# Patient Record
Sex: Male | Born: 2005
Health system: Southern US, Community
[De-identification: ages and names within clinical notes are randomized; demographics above are authoritative.]

---
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
## 2023-01-06 DIAGNOSIS — F4011 Social phobia, generalized: Secondary | ICD-10-CM | POA: Diagnosis not present

## 2023-01-06 DIAGNOSIS — F411 Generalized anxiety disorder: Secondary | ICD-10-CM | POA: Diagnosis not present

## 2023-01-12 DIAGNOSIS — F411 Generalized anxiety disorder: Secondary | ICD-10-CM | POA: Diagnosis not present

## 2023-01-12 DIAGNOSIS — F4011 Social phobia, generalized: Secondary | ICD-10-CM | POA: Diagnosis not present

## 2023-01-16 ENCOUNTER — Other Ambulatory Visit: Payer: Self-pay

## 2023-01-17 ENCOUNTER — Other Ambulatory Visit: Payer: Self-pay

## 2023-01-17 MED ORDER — SERTRALINE HCL 100 MG PO TABS
200.0000 mg | ORAL_TABLET | Freq: Every morning | ORAL | 0 refills | Status: DC
  Filled 2023-01-17: qty 180, 90d supply, fill #0

## 2023-01-17 MED ORDER — LISDEXAMFETAMINE DIMESYLATE 60 MG PO CAPS
60.0000 mg | ORAL_CAPSULE | Freq: Every day | ORAL | 0 refills | Status: DC
  Filled 2023-01-17: qty 30, 30d supply, fill #0

## 2023-01-18 ENCOUNTER — Other Ambulatory Visit: Payer: Self-pay

## 2023-01-20 DIAGNOSIS — F4011 Social phobia, generalized: Secondary | ICD-10-CM | POA: Diagnosis not present

## 2023-01-20 DIAGNOSIS — F411 Generalized anxiety disorder: Secondary | ICD-10-CM | POA: Diagnosis not present

## 2023-02-03 DIAGNOSIS — F411 Generalized anxiety disorder: Secondary | ICD-10-CM | POA: Diagnosis not present

## 2023-02-03 DIAGNOSIS — F4011 Social phobia, generalized: Secondary | ICD-10-CM | POA: Diagnosis not present

## 2023-02-17 DIAGNOSIS — F4011 Social phobia, generalized: Secondary | ICD-10-CM | POA: Diagnosis not present

## 2023-02-17 DIAGNOSIS — F411 Generalized anxiety disorder: Secondary | ICD-10-CM | POA: Diagnosis not present

## 2023-02-21 ENCOUNTER — Other Ambulatory Visit: Payer: Self-pay

## 2023-02-21 DIAGNOSIS — F411 Generalized anxiety disorder: Secondary | ICD-10-CM | POA: Diagnosis not present

## 2023-02-21 DIAGNOSIS — F4011 Social phobia, generalized: Secondary | ICD-10-CM | POA: Diagnosis not present

## 2023-02-21 MED ORDER — LISDEXAMFETAMINE DIMESYLATE 60 MG PO CAPS
60.0000 mg | ORAL_CAPSULE | Freq: Every morning | ORAL | 0 refills | Status: DC
  Filled 2023-03-22: qty 10, 10d supply, fill #0
  Filled 2023-03-22: qty 30, 30d supply, fill #0
  Filled 2023-03-22: qty 20, 20d supply, fill #0

## 2023-02-21 MED ORDER — LISDEXAMFETAMINE DIMESYLATE 60 MG PO CAPS
60.0000 mg | ORAL_CAPSULE | Freq: Every morning | ORAL | 0 refills | Status: DC
  Filled 2023-02-21: qty 30, 30d supply, fill #0

## 2023-03-03 DIAGNOSIS — F411 Generalized anxiety disorder: Secondary | ICD-10-CM | POA: Diagnosis not present

## 2023-03-03 DIAGNOSIS — F4011 Social phobia, generalized: Secondary | ICD-10-CM | POA: Diagnosis not present

## 2023-03-09 DIAGNOSIS — F411 Generalized anxiety disorder: Secondary | ICD-10-CM | POA: Diagnosis not present

## 2023-03-09 DIAGNOSIS — F4011 Social phobia, generalized: Secondary | ICD-10-CM | POA: Diagnosis not present

## 2023-03-17 DIAGNOSIS — F411 Generalized anxiety disorder: Secondary | ICD-10-CM | POA: Diagnosis not present

## 2023-03-17 DIAGNOSIS — F4011 Social phobia, generalized: Secondary | ICD-10-CM | POA: Diagnosis not present

## 2023-03-22 ENCOUNTER — Other Ambulatory Visit: Payer: Self-pay

## 2023-03-31 DIAGNOSIS — F4011 Social phobia, generalized: Secondary | ICD-10-CM | POA: Diagnosis not present

## 2023-03-31 DIAGNOSIS — F411 Generalized anxiety disorder: Secondary | ICD-10-CM | POA: Diagnosis not present

## 2023-04-04 ENCOUNTER — Other Ambulatory Visit: Payer: Self-pay

## 2023-04-04 MED ORDER — LISDEXAMFETAMINE DIMESYLATE 60 MG PO CAPS
60.0000 mg | ORAL_CAPSULE | Freq: Every morning | ORAL | 0 refills | Status: DC
  Filled 2023-05-19: qty 30, 30d supply, fill #0

## 2023-04-04 MED ORDER — LISDEXAMFETAMINE DIMESYLATE 60 MG PO CAPS
60.0000 mg | ORAL_CAPSULE | Freq: Every morning | ORAL | 0 refills | Status: DC
  Filled 2023-04-19: qty 30, 30d supply, fill #0

## 2023-04-05 DIAGNOSIS — F4011 Social phobia, generalized: Secondary | ICD-10-CM | POA: Diagnosis not present

## 2023-04-05 DIAGNOSIS — F411 Generalized anxiety disorder: Secondary | ICD-10-CM | POA: Diagnosis not present

## 2023-04-17 ENCOUNTER — Other Ambulatory Visit: Payer: Self-pay

## 2023-04-18 ENCOUNTER — Other Ambulatory Visit: Payer: Self-pay

## 2023-04-18 MED ORDER — SERTRALINE HCL 100 MG PO TABS
200.0000 mg | ORAL_TABLET | Freq: Every morning | ORAL | 0 refills | Status: DC
  Filled 2023-04-18: qty 180, 90d supply, fill #0

## 2023-04-19 ENCOUNTER — Other Ambulatory Visit: Payer: Self-pay

## 2023-04-19 DIAGNOSIS — F411 Generalized anxiety disorder: Secondary | ICD-10-CM | POA: Diagnosis not present

## 2023-04-19 DIAGNOSIS — F4011 Social phobia, generalized: Secondary | ICD-10-CM | POA: Diagnosis not present

## 2023-05-03 DIAGNOSIS — F4011 Social phobia, generalized: Secondary | ICD-10-CM | POA: Diagnosis not present

## 2023-05-03 DIAGNOSIS — F411 Generalized anxiety disorder: Secondary | ICD-10-CM | POA: Diagnosis not present

## 2023-05-17 DIAGNOSIS — F4011 Social phobia, generalized: Secondary | ICD-10-CM | POA: Diagnosis not present

## 2023-05-17 DIAGNOSIS — F411 Generalized anxiety disorder: Secondary | ICD-10-CM | POA: Diagnosis not present

## 2023-05-19 ENCOUNTER — Other Ambulatory Visit: Payer: Self-pay

## 2023-05-31 DIAGNOSIS — F4011 Social phobia, generalized: Secondary | ICD-10-CM | POA: Diagnosis not present

## 2023-05-31 DIAGNOSIS — F411 Generalized anxiety disorder: Secondary | ICD-10-CM | POA: Diagnosis not present

## 2023-06-01 ENCOUNTER — Other Ambulatory Visit: Payer: Self-pay

## 2023-06-01 MED ORDER — LISDEXAMFETAMINE DIMESYLATE 60 MG PO CAPS: 60.0000 mg | ORAL_CAPSULE | Freq: Every morning | ORAL | 0 refills | Status: DC

## 2023-06-01 MED ORDER — LISDEXAMFETAMINE DIMESYLATE 60 MG PO CAPS
60.0000 mg | ORAL_CAPSULE | Freq: Every morning | ORAL | 0 refills | Status: DC
  Filled 2023-06-20: qty 30, 30d supply, fill #0

## 2023-06-20 ENCOUNTER — Other Ambulatory Visit: Payer: Self-pay

## 2023-06-28 DIAGNOSIS — F4011 Social phobia, generalized: Secondary | ICD-10-CM | POA: Diagnosis not present

## 2023-06-28 DIAGNOSIS — F411 Generalized anxiety disorder: Secondary | ICD-10-CM | POA: Diagnosis not present

## 2023-07-05 ENCOUNTER — Other Ambulatory Visit: Payer: Self-pay

## 2023-07-05 MED ORDER — SERTRALINE HCL 100 MG PO TABS
200.0000 mg | ORAL_TABLET | Freq: Every morning | ORAL | 0 refills | Status: DC
  Filled 2023-07-05: qty 180, 90d supply, fill #0

## 2023-07-12 DIAGNOSIS — F411 Generalized anxiety disorder: Secondary | ICD-10-CM | POA: Diagnosis not present

## 2023-07-12 DIAGNOSIS — F4011 Social phobia, generalized: Secondary | ICD-10-CM | POA: Diagnosis not present

## 2023-07-19 DIAGNOSIS — F4011 Social phobia, generalized: Secondary | ICD-10-CM | POA: Diagnosis not present

## 2023-07-19 DIAGNOSIS — F411 Generalized anxiety disorder: Secondary | ICD-10-CM | POA: Diagnosis not present

## 2023-07-21 ENCOUNTER — Other Ambulatory Visit: Payer: Self-pay

## 2023-07-21 MED ORDER — LISDEXAMFETAMINE DIMESYLATE 60 MG PO CAPS
60.0000 mg | ORAL_CAPSULE | Freq: Every morning | ORAL | 0 refills | Status: AC
  Filled 2023-07-21: qty 30, 30d supply, fill #0

## 2023-07-21 MED ORDER — LISDEXAMFETAMINE DIMESYLATE 60 MG PO CAPS
60.0000 mg | ORAL_CAPSULE | Freq: Every morning | ORAL | 0 refills | Status: DC
  Filled 2023-08-22: qty 30, 30d supply, fill #0

## 2023-07-24 ENCOUNTER — Other Ambulatory Visit: Payer: Self-pay

## 2023-07-25 ENCOUNTER — Other Ambulatory Visit: Payer: Self-pay

## 2023-07-26 DIAGNOSIS — F411 Generalized anxiety disorder: Secondary | ICD-10-CM | POA: Diagnosis not present

## 2023-07-26 DIAGNOSIS — F4011 Social phobia, generalized: Secondary | ICD-10-CM | POA: Diagnosis not present

## 2023-08-02 DIAGNOSIS — F411 Generalized anxiety disorder: Secondary | ICD-10-CM | POA: Diagnosis not present

## 2023-08-02 DIAGNOSIS — F4011 Social phobia, generalized: Secondary | ICD-10-CM | POA: Diagnosis not present

## 2023-08-04 ENCOUNTER — Other Ambulatory Visit (HOSPITAL_COMMUNITY): Payer: Self-pay

## 2023-08-09 DIAGNOSIS — F411 Generalized anxiety disorder: Secondary | ICD-10-CM | POA: Diagnosis not present

## 2023-08-09 DIAGNOSIS — F4011 Social phobia, generalized: Secondary | ICD-10-CM | POA: Diagnosis not present

## 2023-08-16 DIAGNOSIS — F411 Generalized anxiety disorder: Secondary | ICD-10-CM | POA: Diagnosis not present

## 2023-08-16 DIAGNOSIS — F4011 Social phobia, generalized: Secondary | ICD-10-CM | POA: Diagnosis not present

## 2023-08-22 ENCOUNTER — Other Ambulatory Visit: Payer: Self-pay

## 2023-08-30 DIAGNOSIS — F411 Generalized anxiety disorder: Secondary | ICD-10-CM | POA: Diagnosis not present

## 2023-08-30 DIAGNOSIS — F4011 Social phobia, generalized: Secondary | ICD-10-CM | POA: Diagnosis not present

## 2023-09-06 DIAGNOSIS — F411 Generalized anxiety disorder: Secondary | ICD-10-CM | POA: Diagnosis not present

## 2023-09-06 DIAGNOSIS — F4011 Social phobia, generalized: Secondary | ICD-10-CM | POA: Diagnosis not present

## 2023-09-13 DIAGNOSIS — F411 Generalized anxiety disorder: Secondary | ICD-10-CM | POA: Diagnosis not present

## 2023-09-13 DIAGNOSIS — F4011 Social phobia, generalized: Secondary | ICD-10-CM | POA: Diagnosis not present

## 2023-09-19 ENCOUNTER — Other Ambulatory Visit: Payer: Self-pay

## 2023-09-19 MED ORDER — LISDEXAMFETAMINE DIMESYLATE 60 MG PO CAPS
60.0000 mg | ORAL_CAPSULE | Freq: Every morning | ORAL | 0 refills | Status: AC
  Filled 2023-09-19 – 2023-09-20 (×2): qty 30, 30d supply, fill #0

## 2023-09-19 MED ORDER — LISDEXAMFETAMINE DIMESYLATE 60 MG PO CAPS
60.0000 mg | ORAL_CAPSULE | Freq: Every morning | ORAL | 0 refills | Status: DC
  Filled 2023-11-16: qty 30, 30d supply, fill #0

## 2023-09-19 MED ORDER — LISDEXAMFETAMINE DIMESYLATE 60 MG PO CAPS
60.0000 mg | ORAL_CAPSULE | Freq: Every morning | ORAL | 0 refills | Status: AC
  Filled 2023-10-17 – 2023-10-18 (×3): qty 30, 30d supply, fill #0

## 2023-09-20 ENCOUNTER — Other Ambulatory Visit: Payer: Self-pay

## 2023-10-04 DIAGNOSIS — F4011 Social phobia, generalized: Secondary | ICD-10-CM | POA: Diagnosis not present

## 2023-10-04 DIAGNOSIS — F411 Generalized anxiety disorder: Secondary | ICD-10-CM | POA: Diagnosis not present

## 2023-10-06 ENCOUNTER — Other Ambulatory Visit: Payer: Self-pay

## 2023-10-06 MED ORDER — SERTRALINE HCL 100 MG PO TABS
200.0000 mg | ORAL_TABLET | Freq: Every morning | ORAL | 0 refills | Status: DC
Start: 2023-10-05 — End: 2024-01-16
  Filled 2023-10-06 – 2023-10-18 (×2): qty 180, 90d supply, fill #0

## 2023-10-11 DIAGNOSIS — F4011 Social phobia, generalized: Secondary | ICD-10-CM | POA: Diagnosis not present

## 2023-10-11 DIAGNOSIS — F411 Generalized anxiety disorder: Secondary | ICD-10-CM | POA: Diagnosis not present

## 2023-10-16 ENCOUNTER — Other Ambulatory Visit: Payer: Self-pay

## 2023-10-17 ENCOUNTER — Other Ambulatory Visit (HOSPITAL_COMMUNITY): Payer: Self-pay

## 2023-10-17 ENCOUNTER — Other Ambulatory Visit: Payer: Self-pay

## 2023-10-18 ENCOUNTER — Other Ambulatory Visit (HOSPITAL_COMMUNITY): Payer: Self-pay

## 2023-10-18 ENCOUNTER — Other Ambulatory Visit: Payer: Self-pay

## 2023-10-18 DIAGNOSIS — F411 Generalized anxiety disorder: Secondary | ICD-10-CM | POA: Diagnosis not present

## 2023-10-18 DIAGNOSIS — F4011 Social phobia, generalized: Secondary | ICD-10-CM | POA: Diagnosis not present

## 2023-10-25 DIAGNOSIS — F4011 Social phobia, generalized: Secondary | ICD-10-CM | POA: Diagnosis not present

## 2023-10-25 DIAGNOSIS — F411 Generalized anxiety disorder: Secondary | ICD-10-CM | POA: Diagnosis not present

## 2023-10-26 DIAGNOSIS — F909 Attention-deficit hyperactivity disorder, unspecified type: Secondary | ICD-10-CM | POA: Diagnosis not present

## 2023-10-26 DIAGNOSIS — Z1331 Encounter for screening for depression: Secondary | ICD-10-CM | POA: Diagnosis not present

## 2023-10-26 DIAGNOSIS — Z113 Encounter for screening for infections with a predominantly sexual mode of transmission: Secondary | ICD-10-CM | POA: Diagnosis not present

## 2023-10-26 DIAGNOSIS — Z1330 Encounter for screening examination for mental health and behavioral disorders, unspecified: Secondary | ICD-10-CM | POA: Diagnosis not present

## 2023-10-26 DIAGNOSIS — Z136 Encounter for screening for cardiovascular disorders: Secondary | ICD-10-CM | POA: Diagnosis not present

## 2023-10-26 DIAGNOSIS — Z789 Other specified health status: Secondary | ICD-10-CM | POA: Diagnosis not present

## 2023-10-26 DIAGNOSIS — Z9189 Other specified personal risk factors, not elsewhere classified: Secondary | ICD-10-CM | POA: Diagnosis not present

## 2023-10-26 DIAGNOSIS — Z0000 Encounter for general adult medical examination without abnormal findings: Secondary | ICD-10-CM | POA: Diagnosis not present

## 2023-10-26 DIAGNOSIS — Z114 Encounter for screening for human immunodeficiency virus [HIV]: Secondary | ICD-10-CM | POA: Diagnosis not present

## 2023-10-26 DIAGNOSIS — Z1159 Encounter for screening for other viral diseases: Secondary | ICD-10-CM | POA: Diagnosis not present

## 2023-11-01 DIAGNOSIS — F4011 Social phobia, generalized: Secondary | ICD-10-CM | POA: Diagnosis not present

## 2023-11-01 DIAGNOSIS — F411 Generalized anxiety disorder: Secondary | ICD-10-CM | POA: Diagnosis not present

## 2023-11-07 DIAGNOSIS — Z1159 Encounter for screening for other viral diseases: Secondary | ICD-10-CM | POA: Diagnosis not present

## 2023-11-07 DIAGNOSIS — Z114 Encounter for screening for human immunodeficiency virus [HIV]: Secondary | ICD-10-CM | POA: Diagnosis not present

## 2023-11-07 DIAGNOSIS — Z113 Encounter for screening for infections with a predominantly sexual mode of transmission: Secondary | ICD-10-CM | POA: Diagnosis not present

## 2023-11-07 DIAGNOSIS — Z0000 Encounter for general adult medical examination without abnormal findings: Secondary | ICD-10-CM | POA: Diagnosis not present

## 2023-11-07 DIAGNOSIS — Z136 Encounter for screening for cardiovascular disorders: Secondary | ICD-10-CM | POA: Diagnosis not present

## 2023-11-08 DIAGNOSIS — F411 Generalized anxiety disorder: Secondary | ICD-10-CM | POA: Diagnosis not present

## 2023-11-08 DIAGNOSIS — F4011 Social phobia, generalized: Secondary | ICD-10-CM | POA: Diagnosis not present

## 2023-11-15 DIAGNOSIS — F411 Generalized anxiety disorder: Secondary | ICD-10-CM | POA: Diagnosis not present

## 2023-11-15 DIAGNOSIS — F4011 Social phobia, generalized: Secondary | ICD-10-CM | POA: Diagnosis not present

## 2023-11-15 DIAGNOSIS — L648 Other androgenic alopecia: Secondary | ICD-10-CM | POA: Diagnosis not present

## 2023-11-16 ENCOUNTER — Other Ambulatory Visit: Payer: Self-pay

## 2023-11-16 ENCOUNTER — Other Ambulatory Visit (HOSPITAL_COMMUNITY): Payer: Self-pay

## 2023-11-29 DIAGNOSIS — F411 Generalized anxiety disorder: Secondary | ICD-10-CM | POA: Diagnosis not present

## 2023-11-29 DIAGNOSIS — F4011 Social phobia, generalized: Secondary | ICD-10-CM | POA: Diagnosis not present

## 2023-12-06 DIAGNOSIS — F411 Generalized anxiety disorder: Secondary | ICD-10-CM | POA: Diagnosis not present

## 2023-12-06 DIAGNOSIS — F4011 Social phobia, generalized: Secondary | ICD-10-CM | POA: Diagnosis not present

## 2023-12-13 DIAGNOSIS — F4011 Social phobia, generalized: Secondary | ICD-10-CM | POA: Diagnosis not present

## 2023-12-13 DIAGNOSIS — F411 Generalized anxiety disorder: Secondary | ICD-10-CM | POA: Diagnosis not present

## 2023-12-18 ENCOUNTER — Other Ambulatory Visit: Payer: Self-pay

## 2023-12-18 ENCOUNTER — Other Ambulatory Visit (HOSPITAL_COMMUNITY): Payer: Self-pay

## 2023-12-19 ENCOUNTER — Other Ambulatory Visit: Payer: Self-pay

## 2023-12-20 ENCOUNTER — Other Ambulatory Visit: Payer: Self-pay

## 2023-12-20 DIAGNOSIS — F411 Generalized anxiety disorder: Secondary | ICD-10-CM | POA: Diagnosis not present

## 2023-12-20 DIAGNOSIS — F4011 Social phobia, generalized: Secondary | ICD-10-CM | POA: Diagnosis not present

## 2023-12-20 MED ORDER — LISDEXAMFETAMINE DIMESYLATE 60 MG PO CAPS
60.0000 mg | ORAL_CAPSULE | Freq: Every morning | ORAL | 0 refills | Status: DC
  Filled 2024-02-21: qty 30, 30d supply, fill #0

## 2023-12-20 MED ORDER — LISDEXAMFETAMINE DIMESYLATE 60 MG PO CAPS
60.0000 mg | ORAL_CAPSULE | Freq: Every morning | ORAL | 0 refills | Status: AC
  Filled 2023-12-20: qty 23, 23d supply, fill #0
  Filled 2023-12-20: qty 7, 7d supply, fill #0
  Filled 2023-12-25 (×2): qty 30, 30d supply, fill #0

## 2023-12-20 MED ORDER — LISDEXAMFETAMINE DIMESYLATE 60 MG PO CAPS
60.0000 mg | ORAL_CAPSULE | Freq: Every morning | ORAL | 0 refills | Status: AC
  Filled 2024-01-23: qty 30, 30d supply, fill #0

## 2023-12-25 ENCOUNTER — Other Ambulatory Visit (HOSPITAL_COMMUNITY): Payer: Self-pay

## 2023-12-25 ENCOUNTER — Other Ambulatory Visit: Payer: Self-pay

## 2023-12-27 ENCOUNTER — Other Ambulatory Visit (HOSPITAL_COMMUNITY): Payer: Self-pay

## 2023-12-27 DIAGNOSIS — F4011 Social phobia, generalized: Secondary | ICD-10-CM | POA: Diagnosis not present

## 2023-12-27 DIAGNOSIS — F411 Generalized anxiety disorder: Secondary | ICD-10-CM | POA: Diagnosis not present

## 2024-01-03 DIAGNOSIS — F411 Generalized anxiety disorder: Secondary | ICD-10-CM | POA: Diagnosis not present

## 2024-01-03 DIAGNOSIS — F4011 Social phobia, generalized: Secondary | ICD-10-CM | POA: Diagnosis not present

## 2024-01-10 DIAGNOSIS — F4011 Social phobia, generalized: Secondary | ICD-10-CM | POA: Diagnosis not present

## 2024-01-10 DIAGNOSIS — F411 Generalized anxiety disorder: Secondary | ICD-10-CM | POA: Diagnosis not present

## 2024-01-15 ENCOUNTER — Other Ambulatory Visit (HOSPITAL_COMMUNITY): Payer: Self-pay

## 2024-01-16 ENCOUNTER — Other Ambulatory Visit: Payer: Self-pay

## 2024-01-16 ENCOUNTER — Other Ambulatory Visit (HOSPITAL_COMMUNITY): Payer: Self-pay

## 2024-01-16 MED ORDER — SERTRALINE HCL 100 MG PO TABS
200.0000 mg | ORAL_TABLET | Freq: Every morning | ORAL | 0 refills | Status: AC
  Filled 2024-01-16: qty 180, 90d supply, fill #0

## 2024-01-17 DIAGNOSIS — F4011 Social phobia, generalized: Secondary | ICD-10-CM | POA: Diagnosis not present

## 2024-01-17 DIAGNOSIS — F411 Generalized anxiety disorder: Secondary | ICD-10-CM | POA: Diagnosis not present

## 2024-01-23 ENCOUNTER — Other Ambulatory Visit (HOSPITAL_COMMUNITY): Payer: Self-pay

## 2024-01-23 ENCOUNTER — Other Ambulatory Visit: Payer: Self-pay

## 2024-01-31 DIAGNOSIS — F4011 Social phobia, generalized: Secondary | ICD-10-CM | POA: Diagnosis not present

## 2024-01-31 DIAGNOSIS — F411 Generalized anxiety disorder: Secondary | ICD-10-CM | POA: Diagnosis not present

## 2024-02-21 ENCOUNTER — Other Ambulatory Visit (HOSPITAL_COMMUNITY): Payer: Self-pay

## 2024-02-21 ENCOUNTER — Other Ambulatory Visit: Payer: Self-pay

## 2024-02-21 DIAGNOSIS — F411 Generalized anxiety disorder: Secondary | ICD-10-CM | POA: Diagnosis not present

## 2024-02-21 DIAGNOSIS — F4011 Social phobia, generalized: Secondary | ICD-10-CM | POA: Diagnosis not present

## 2024-02-28 DIAGNOSIS — F4011 Social phobia, generalized: Secondary | ICD-10-CM | POA: Diagnosis not present

## 2024-02-28 DIAGNOSIS — F411 Generalized anxiety disorder: Secondary | ICD-10-CM | POA: Diagnosis not present

## 2024-03-13 DIAGNOSIS — F4011 Social phobia, generalized: Secondary | ICD-10-CM | POA: Diagnosis not present

## 2024-03-13 DIAGNOSIS — F411 Generalized anxiety disorder: Secondary | ICD-10-CM | POA: Diagnosis not present

## 2024-03-22 ENCOUNTER — Other Ambulatory Visit (HOSPITAL_COMMUNITY): Payer: Self-pay

## 2024-03-22 ENCOUNTER — Other Ambulatory Visit: Payer: Self-pay

## 2024-03-24 ENCOUNTER — Other Ambulatory Visit: Payer: Self-pay

## 2024-03-25 ENCOUNTER — Other Ambulatory Visit: Payer: Self-pay

## 2024-03-25 MED ORDER — LISDEXAMFETAMINE DIMESYLATE 60 MG PO CAPS
60.0000 mg | ORAL_CAPSULE | Freq: Every morning | ORAL | 0 refills | Status: AC
  Filled 2024-04-26: qty 30, 30d supply, fill #0

## 2024-03-25 MED ORDER — LISDEXAMFETAMINE DIMESYLATE 60 MG PO CAPS
60.0000 mg | ORAL_CAPSULE | Freq: Every morning | ORAL | 0 refills | Status: AC
  Filled 2024-03-25 – 2024-03-27 (×2): qty 30, 30d supply, fill #0

## 2024-03-25 MED ORDER — LISDEXAMFETAMINE DIMESYLATE 60 MG PO CAPS
60.0000 mg | ORAL_CAPSULE | Freq: Every morning | ORAL | 0 refills | Status: DC
  Filled 2024-05-29: qty 30, 30d supply, fill #0

## 2024-03-27 ENCOUNTER — Other Ambulatory Visit: Payer: Self-pay

## 2024-03-27 ENCOUNTER — Other Ambulatory Visit (HOSPITAL_COMMUNITY): Payer: Self-pay

## 2024-03-27 DIAGNOSIS — F4011 Social phobia, generalized: Secondary | ICD-10-CM | POA: Diagnosis not present

## 2024-03-27 DIAGNOSIS — F411 Generalized anxiety disorder: Secondary | ICD-10-CM | POA: Diagnosis not present

## 2024-04-10 DIAGNOSIS — F411 Generalized anxiety disorder: Secondary | ICD-10-CM | POA: Diagnosis not present

## 2024-04-10 DIAGNOSIS — F4011 Social phobia, generalized: Secondary | ICD-10-CM | POA: Diagnosis not present

## 2024-04-14 ENCOUNTER — Other Ambulatory Visit (HOSPITAL_COMMUNITY): Payer: Self-pay

## 2024-04-16 ENCOUNTER — Other Ambulatory Visit: Payer: Self-pay

## 2024-04-16 ENCOUNTER — Other Ambulatory Visit (HOSPITAL_COMMUNITY): Payer: Self-pay

## 2024-04-16 MED ORDER — SERTRALINE HCL 100 MG PO TABS
200.0000 mg | ORAL_TABLET | Freq: Every morning | ORAL | 0 refills | Status: DC
  Filled 2024-04-16: qty 180, 90d supply, fill #0

## 2024-04-24 DIAGNOSIS — F411 Generalized anxiety disorder: Secondary | ICD-10-CM | POA: Diagnosis not present

## 2024-04-24 DIAGNOSIS — F4011 Social phobia, generalized: Secondary | ICD-10-CM | POA: Diagnosis not present

## 2024-04-26 ENCOUNTER — Other Ambulatory Visit: Payer: Self-pay

## 2024-04-26 ENCOUNTER — Other Ambulatory Visit (HOSPITAL_COMMUNITY): Payer: Self-pay

## 2024-05-17 ENCOUNTER — Other Ambulatory Visit (HOSPITAL_COMMUNITY): Payer: Self-pay

## 2024-05-17 ENCOUNTER — Other Ambulatory Visit: Payer: Self-pay

## 2024-05-17 DIAGNOSIS — L648 Other androgenic alopecia: Secondary | ICD-10-CM | POA: Diagnosis not present

## 2024-05-17 MED ORDER — PROPRANOLOL HCL 20 MG PO TABS
20.0000 mg | ORAL_TABLET | Freq: Two times a day (BID) | ORAL | 1 refills | Status: AC | PRN
  Filled 2024-05-17 (×2): qty 60, 30d supply, fill #0

## 2024-05-22 DIAGNOSIS — F411 Generalized anxiety disorder: Secondary | ICD-10-CM | POA: Diagnosis not present

## 2024-05-22 DIAGNOSIS — F4011 Social phobia, generalized: Secondary | ICD-10-CM | POA: Diagnosis not present

## 2024-05-29 ENCOUNTER — Other Ambulatory Visit (HOSPITAL_COMMUNITY): Payer: Self-pay

## 2024-05-30 ENCOUNTER — Other Ambulatory Visit (HOSPITAL_COMMUNITY): Payer: Self-pay

## 2024-06-07 DIAGNOSIS — F411 Generalized anxiety disorder: Secondary | ICD-10-CM | POA: Diagnosis not present

## 2024-06-07 DIAGNOSIS — F4011 Social phobia, generalized: Secondary | ICD-10-CM | POA: Diagnosis not present

## 2024-07-01 ENCOUNTER — Other Ambulatory Visit: Payer: Self-pay

## 2024-07-01 ENCOUNTER — Other Ambulatory Visit (HOSPITAL_COMMUNITY): Payer: Self-pay

## 2024-07-02 ENCOUNTER — Other Ambulatory Visit (HOSPITAL_COMMUNITY): Payer: Self-pay

## 2024-07-02 ENCOUNTER — Other Ambulatory Visit: Payer: Self-pay

## 2024-07-02 MED ORDER — LISDEXAMFETAMINE DIMESYLATE 60 MG PO CAPS
60.0000 mg | ORAL_CAPSULE | Freq: Every morning | ORAL | 0 refills | Status: AC
  Filled 2024-07-02 (×2): qty 30, 30d supply, fill #0

## 2024-07-02 MED ORDER — LISDEXAMFETAMINE DIMESYLATE 60 MG PO CAPS: 60.0000 mg | ORAL_CAPSULE | Freq: Every morning | ORAL | 0 refills | Status: AC

## 2024-07-16 ENCOUNTER — Other Ambulatory Visit (HOSPITAL_COMMUNITY): Payer: Self-pay

## 2024-07-17 ENCOUNTER — Other Ambulatory Visit (HOSPITAL_COMMUNITY): Payer: Self-pay

## 2024-07-17 ENCOUNTER — Other Ambulatory Visit: Payer: Self-pay

## 2024-07-17 MED ORDER — SERTRALINE HCL 100 MG PO TABS
200.0000 mg | ORAL_TABLET | Freq: Every morning | ORAL | 0 refills | Status: AC
  Filled 2024-07-17: qty 180, 90d supply, fill #0

## 2024-07-18 ENCOUNTER — Other Ambulatory Visit (HOSPITAL_COMMUNITY): Payer: Self-pay

## 2024-07-18 ENCOUNTER — Other Ambulatory Visit: Payer: Self-pay
